# Patient Record
Sex: Female | Born: 1960 | Race: White | Hispanic: No | Marital: Single
Health system: Southern US, Community
[De-identification: ages and names within clinical notes are randomized; demographics above are authoritative.]

---

## 1999-01-12 ENCOUNTER — Other Ambulatory Visit: Admission: RE | Admit: 1999-01-12 | Discharge: 1999-01-12 | Payer: Self-pay | Admitting: Obstetrics and Gynecology

## 1999-12-05 ENCOUNTER — Other Ambulatory Visit: Admission: RE | Admit: 1999-12-05 | Discharge: 1999-12-05 | Payer: Self-pay | Admitting: Obstetrics and Gynecology

## 1999-12-05 ENCOUNTER — Other Ambulatory Visit: Admission: RE | Admit: 1999-12-05 | Discharge: 1999-12-05 | Payer: Self-pay | Admitting: *Deleted

## 2000-05-03 ENCOUNTER — Inpatient Hospital Stay (HOSPITAL_COMMUNITY): Admission: AD | Admit: 2000-05-03 | Discharge: 2000-05-03 | Payer: Self-pay | Admitting: *Deleted

## 2000-06-20 ENCOUNTER — Inpatient Hospital Stay (HOSPITAL_COMMUNITY): Admission: AD | Admit: 2000-06-20 | Discharge: 2000-06-23 | Payer: Self-pay | Admitting: Obstetrics & Gynecology

## 2000-08-09 ENCOUNTER — Other Ambulatory Visit: Admission: RE | Admit: 2000-08-09 | Discharge: 2000-08-09 | Payer: Self-pay | Admitting: Obstetrics and Gynecology

## 2001-07-26 ENCOUNTER — Inpatient Hospital Stay (HOSPITAL_COMMUNITY): Admission: AD | Admit: 2001-07-26 | Discharge: 2001-07-28 | Payer: Self-pay | Admitting: Obstetrics and Gynecology

## 2002-10-10 ENCOUNTER — Other Ambulatory Visit: Admission: RE | Admit: 2002-10-10 | Discharge: 2002-10-10 | Payer: Self-pay | Admitting: Obstetrics & Gynecology

## 2003-12-15 ENCOUNTER — Other Ambulatory Visit: Admission: RE | Admit: 2003-12-15 | Discharge: 2003-12-15 | Payer: Self-pay | Admitting: Obstetrics & Gynecology

## 2004-09-19 ENCOUNTER — Other Ambulatory Visit: Admission: RE | Admit: 2004-09-19 | Discharge: 2004-09-19 | Payer: Self-pay | Admitting: Obstetrics and Gynecology

## 2012-02-07 ENCOUNTER — Other Ambulatory Visit: Payer: Self-pay | Admitting: Obstetrics and Gynecology

## 2012-02-07 DIAGNOSIS — N63 Unspecified lump in unspecified breast: Secondary | ICD-10-CM

## 2012-02-07 DIAGNOSIS — R922 Inconclusive mammogram: Secondary | ICD-10-CM

## 2012-02-20 ENCOUNTER — Other Ambulatory Visit: Payer: Self-pay | Admitting: Obstetrics and Gynecology

## 2012-02-20 ENCOUNTER — Ambulatory Visit
Admission: RE | Admit: 2012-02-20 | Discharge: 2012-02-20 | Disposition: A | Discharge status: Home / Self Care | Payer: 59 | Source: Ambulatory Visit | Attending: Obstetrics and Gynecology | Admitting: Obstetrics and Gynecology

## 2012-02-20 DIAGNOSIS — R922 Inconclusive mammogram: Secondary | ICD-10-CM

## 2012-02-20 DIAGNOSIS — N63 Unspecified lump in unspecified breast: Secondary | ICD-10-CM

## 2013-08-11 ENCOUNTER — Other Ambulatory Visit: Payer: Self-pay | Admitting: Obstetrics and Gynecology

## 2013-08-12 LAB — CYTOLOGY - PAP

## 2014-06-17 ENCOUNTER — Other Ambulatory Visit: Payer: Self-pay | Admitting: Family Medicine

## 2014-06-17 DIAGNOSIS — R109 Unspecified abdominal pain: Secondary | ICD-10-CM

## 2014-06-22 ENCOUNTER — Ambulatory Visit
Admission: RE | Admit: 2014-06-22 | Discharge: 2014-06-22 | Disposition: A | Discharge status: Home / Self Care | Payer: 59 | Source: Ambulatory Visit | Attending: Family Medicine | Admitting: Family Medicine

## 2014-06-22 DIAGNOSIS — R109 Unspecified abdominal pain: Secondary | ICD-10-CM

## 2014-06-22 MED ORDER — IOPAMIDOL (ISOVUE-300) INJECTION 61%
100.0000 mL | Freq: Once | INTRAVENOUS | Status: AC | PRN
  Administered 2014-06-22: 100 mL via INTRAVENOUS

## 2014-06-25 ENCOUNTER — Other Ambulatory Visit: Payer: Self-pay

## 2014-08-25 ENCOUNTER — Other Ambulatory Visit: Payer: Self-pay | Admitting: Gastroenterology

## 2018-09-24 ENCOUNTER — Other Ambulatory Visit: Payer: Self-pay | Admitting: Obstetrics and Gynecology

## 2018-09-24 DIAGNOSIS — N644 Mastodynia: Secondary | ICD-10-CM

## 2018-10-01 ENCOUNTER — Ambulatory Visit
Admission: RE | Admit: 2018-10-01 | Discharge: 2018-10-01 | Disposition: A | Discharge status: Home / Self Care | Payer: 59 | Source: Ambulatory Visit | Attending: Obstetrics and Gynecology | Admitting: Obstetrics and Gynecology

## 2018-10-01 ENCOUNTER — Ambulatory Visit: Payer: 59

## 2018-10-01 ENCOUNTER — Other Ambulatory Visit: Payer: Self-pay

## 2018-10-01 DIAGNOSIS — N644 Mastodynia: Secondary | ICD-10-CM

## 2019-04-28 ENCOUNTER — Telehealth: Payer: Self-pay

## 2019-04-28 NOTE — Telephone Encounter (Signed)
NOTES ON FILE FROM JOHN MCCOMB [phone], SENT REFERRAL TO SCHEDULING 

## 2019-04-28 NOTE — Telephone Encounter (Signed)
NOTES ON FILE FROM dr Richardean Chimera [phone], SENT REFERRAL TO SCHEDULING

## 2019-05-27 ENCOUNTER — Encounter: Payer: Self-pay | Admitting: Family Medicine

## 2019-05-27 ENCOUNTER — Encounter: Payer: Self-pay | Admitting: General Practice

## 2019-05-28 ENCOUNTER — Encounter: Payer: Self-pay | Admitting: Family Medicine

## 2019-06-03 ENCOUNTER — Encounter: Payer: Self-pay | Admitting: General Practice

## 2019-08-14 ENCOUNTER — Other Ambulatory Visit: Payer: Self-pay | Admitting: Family Medicine

## 2019-08-14 DIAGNOSIS — Z8249 Family history of ischemic heart disease and other diseases of the circulatory system: Secondary | ICD-10-CM

## 2019-08-14 DIAGNOSIS — R7989 Other specified abnormal findings of blood chemistry: Secondary | ICD-10-CM

## 2019-08-14 DIAGNOSIS — R1013 Epigastric pain: Secondary | ICD-10-CM

## 2019-08-21 ENCOUNTER — Ambulatory Visit
Admission: RE | Admit: 2019-08-21 | Discharge: 2019-08-21 | Disposition: A | Discharge status: Home / Self Care | Payer: 59 | Source: Ambulatory Visit | Attending: Family Medicine | Admitting: Family Medicine

## 2019-08-21 DIAGNOSIS — R1013 Epigastric pain: Secondary | ICD-10-CM

## 2019-08-25 ENCOUNTER — Other Ambulatory Visit: Payer: 59

## 2019-08-28 ENCOUNTER — Other Ambulatory Visit: Payer: 59

## 2019-08-29 ENCOUNTER — Ambulatory Visit
Admission: RE | Admit: 2019-08-29 | Discharge: 2019-08-29 | Disposition: A | Discharge status: Home / Self Care | Payer: No Typology Code available for payment source | Source: Ambulatory Visit | Attending: Family Medicine | Admitting: Family Medicine

## 2019-08-29 DIAGNOSIS — R7989 Other specified abnormal findings of blood chemistry: Secondary | ICD-10-CM

## 2019-08-29 DIAGNOSIS — Z8249 Family history of ischemic heart disease and other diseases of the circulatory system: Secondary | ICD-10-CM

## 2019-10-21 ENCOUNTER — Ambulatory Visit: Payer: 59 | Admitting: Internal Medicine

## 2019-11-06 ENCOUNTER — Other Ambulatory Visit: Payer: Self-pay | Admitting: Obstetrics and Gynecology

## 2019-11-06 DIAGNOSIS — Z1231 Encounter for screening mammogram for malignant neoplasm of breast: Secondary | ICD-10-CM

## 2019-12-12 ENCOUNTER — Ambulatory Visit
Admission: RE | Admit: 2019-12-12 | Discharge: 2019-12-12 | Disposition: A | Discharge status: Home / Self Care | Payer: 59 | Source: Ambulatory Visit | Attending: Obstetrics and Gynecology | Admitting: Obstetrics and Gynecology

## 2019-12-12 ENCOUNTER — Other Ambulatory Visit: Payer: Self-pay

## 2019-12-12 DIAGNOSIS — Z1231 Encounter for screening mammogram for malignant neoplasm of breast: Secondary | ICD-10-CM

## 2020-01-22 ENCOUNTER — Ambulatory Visit: Payer: 59

## 2020-03-04 ENCOUNTER — Other Ambulatory Visit: Payer: Self-pay | Admitting: Obstetrics and Gynecology

## 2020-03-04 ENCOUNTER — Other Ambulatory Visit: Payer: Self-pay

## 2020-03-04 ENCOUNTER — Ambulatory Visit
Admission: RE | Admit: 2020-03-04 | Discharge: 2020-03-04 | Disposition: A | Discharge status: Home / Self Care | Payer: 59 | Source: Ambulatory Visit | Attending: Obstetrics and Gynecology | Admitting: Obstetrics and Gynecology

## 2020-03-04 DIAGNOSIS — Z1231 Encounter for screening mammogram for malignant neoplasm of breast: Secondary | ICD-10-CM

## 2021-09-26 ENCOUNTER — Other Ambulatory Visit: Payer: Self-pay | Admitting: Family Medicine

## 2021-09-26 DIAGNOSIS — Z1231 Encounter for screening mammogram for malignant neoplasm of breast: Secondary | ICD-10-CM

## 2021-10-19 ENCOUNTER — Ambulatory Visit: Payer: 59

## 2022-08-22 ENCOUNTER — Ambulatory Visit
Admission: RE | Admit: 2022-08-22 | Discharge: 2022-08-22 | Disposition: A | Discharge status: Home / Self Care | Payer: No Typology Code available for payment source | Source: Ambulatory Visit | Attending: Family Medicine | Admitting: Family Medicine

## 2022-08-22 DIAGNOSIS — Z1231 Encounter for screening mammogram for malignant neoplasm of breast: Secondary | ICD-10-CM

## 2022-08-24 ENCOUNTER — Other Ambulatory Visit: Payer: Self-pay | Admitting: Family Medicine

## 2022-08-24 DIAGNOSIS — R928 Other abnormal and inconclusive findings on diagnostic imaging of breast: Secondary | ICD-10-CM

## 2022-09-05 ENCOUNTER — Ambulatory Visit
Admission: RE | Admit: 2022-09-05 | Discharge: 2022-09-05 | Disposition: A | Discharge status: Home / Self Care | Payer: No Typology Code available for payment source | Source: Ambulatory Visit | Attending: Family Medicine | Admitting: Family Medicine

## 2022-09-05 DIAGNOSIS — R928 Other abnormal and inconclusive findings on diagnostic imaging of breast: Secondary | ICD-10-CM

## 2022-09-29 IMAGING — MG MM DIGITAL SCREENING BILAT W/ TOMO AND CAD
8 series · 9 of 24 positions shown · non-contrast
Comparison: Previous exam(s).

CLINICAL DATA: Screening.

EXAM:
DIGITAL SCREENING BILATERAL MAMMOGRAM WITH TOMOSYNTHESIS AND CAD
TECHNIQUE: Bilateral screening digital craniocaudal and mediolateral oblique
mammograms were obtained. Bilateral screening digital breast
tomosynthesis was performed. The images were evaluated with
computer-aided detection.

[L CC synth-2D]
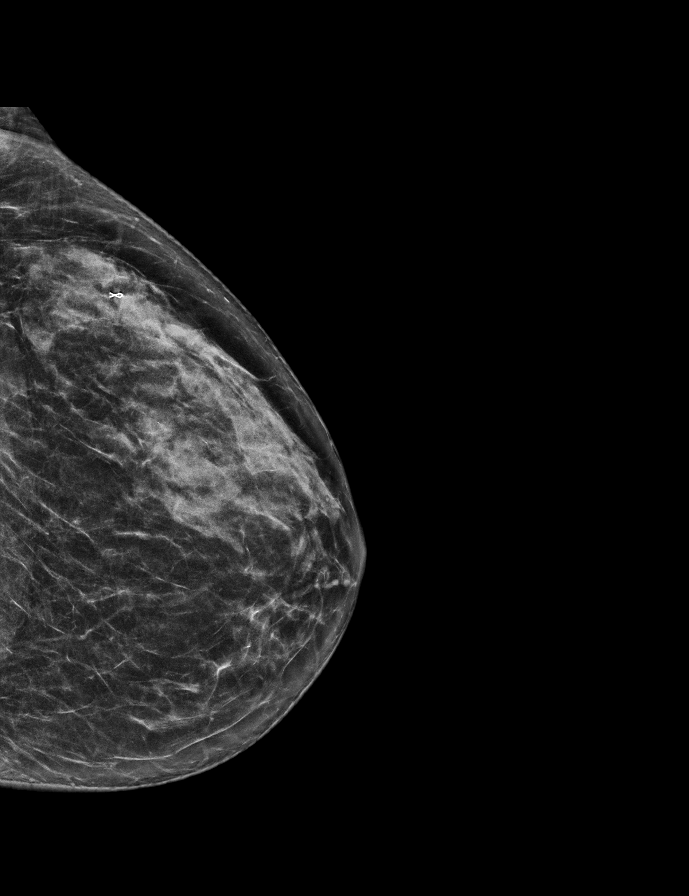

[R MLO synth-2D]
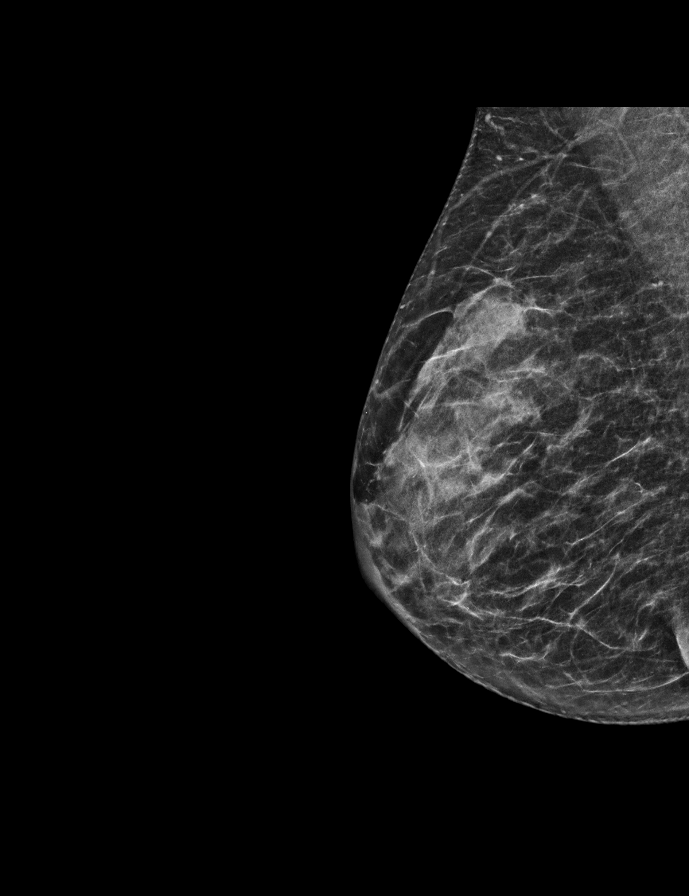

[R CC synth-2D]
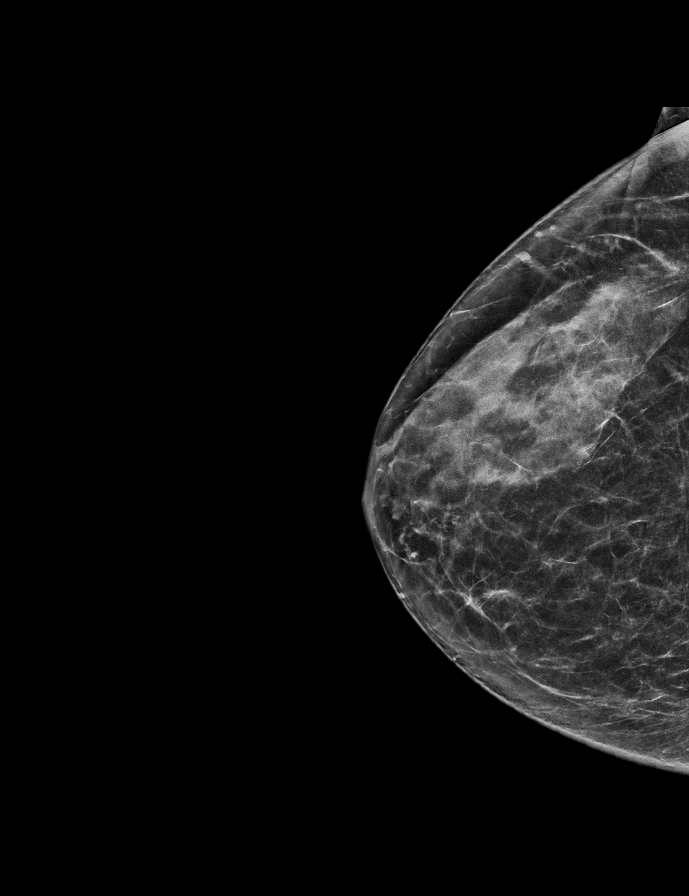

[L MLO synth-2D]
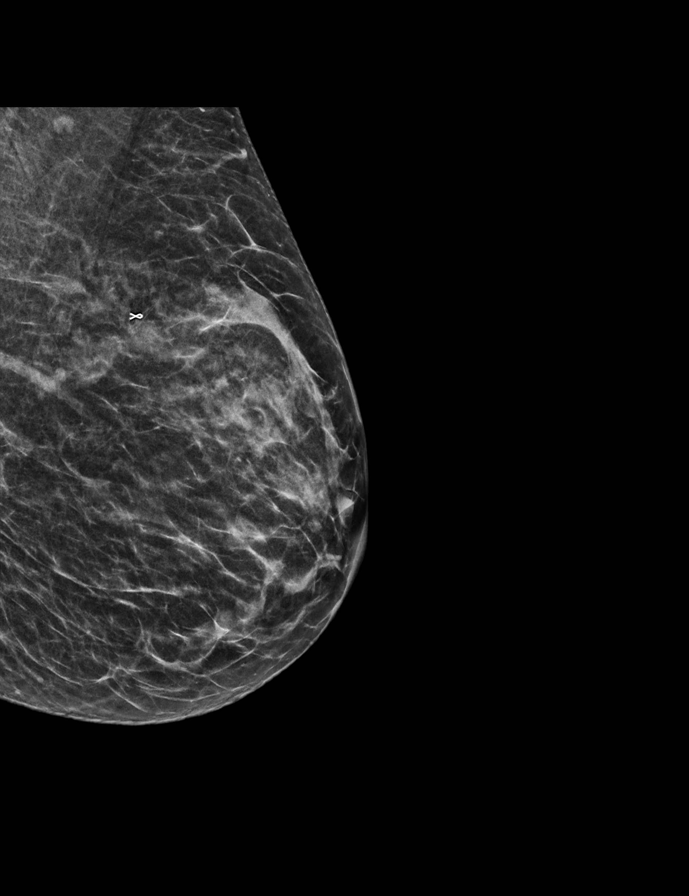

[R CC tomo · 2 of 57 frames shown]
[frame 19/57]
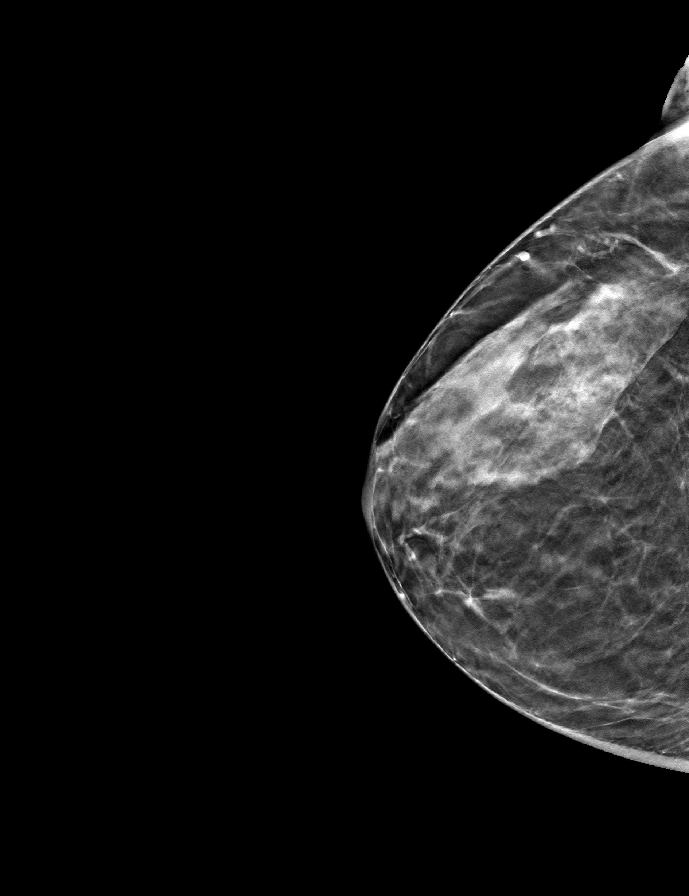
[frame 29/57]
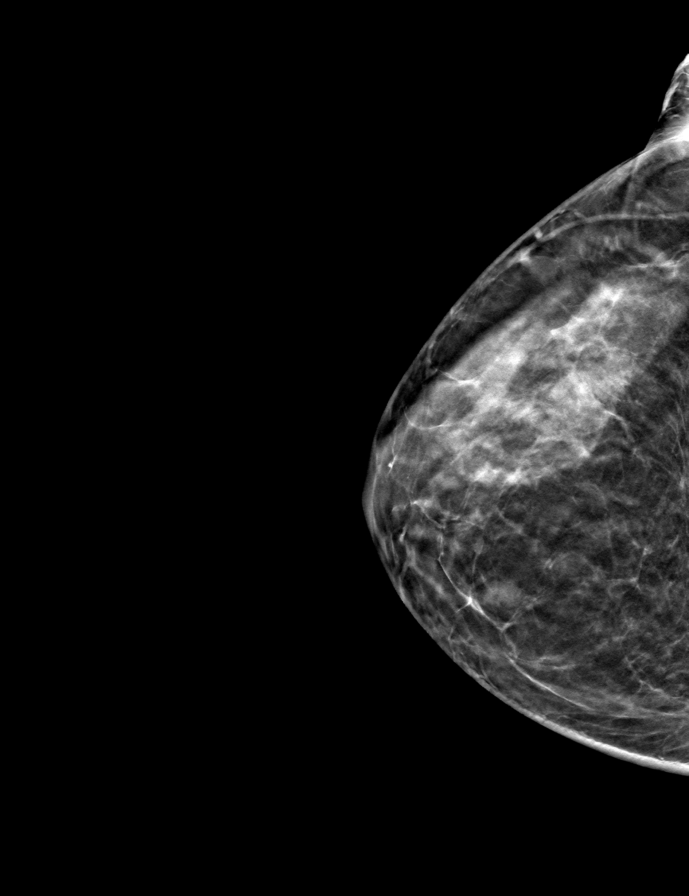

[L CC tomo · tomo slice 27/53.0]
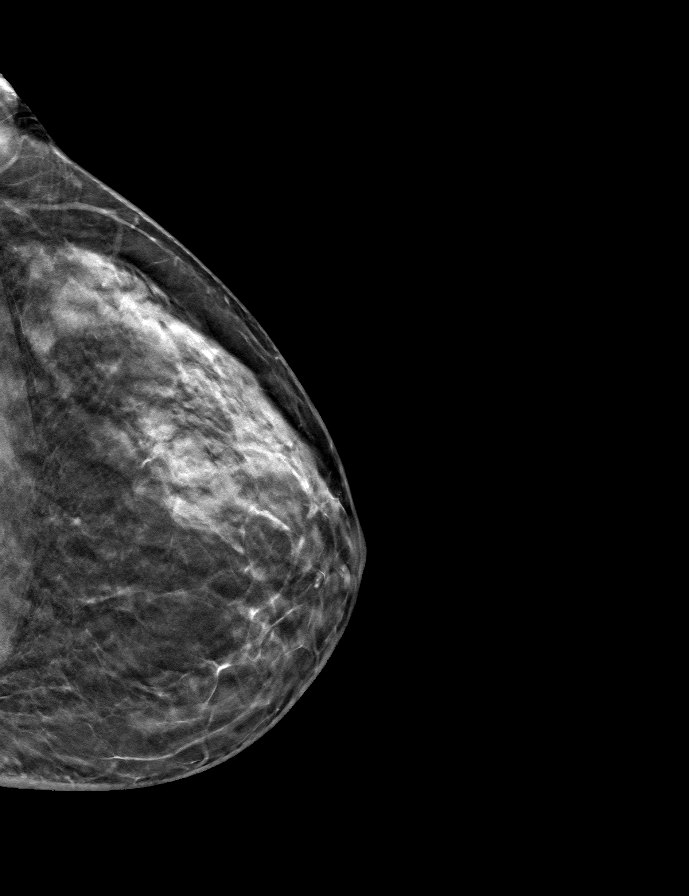

[R MLO tomo · tomo slice 26/51.0]
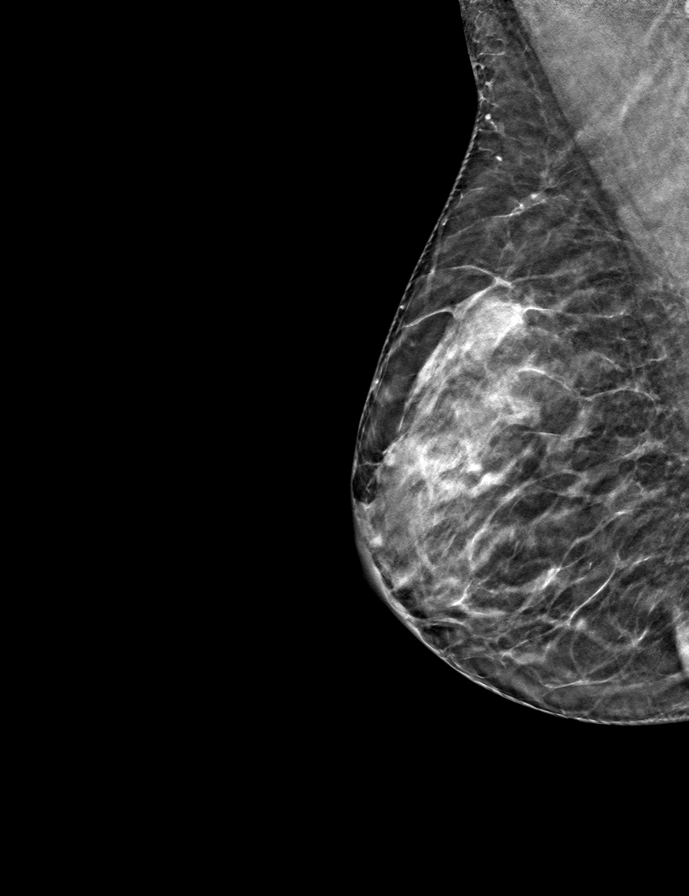

[L MLO tomo · tomo slice 25/48.0]
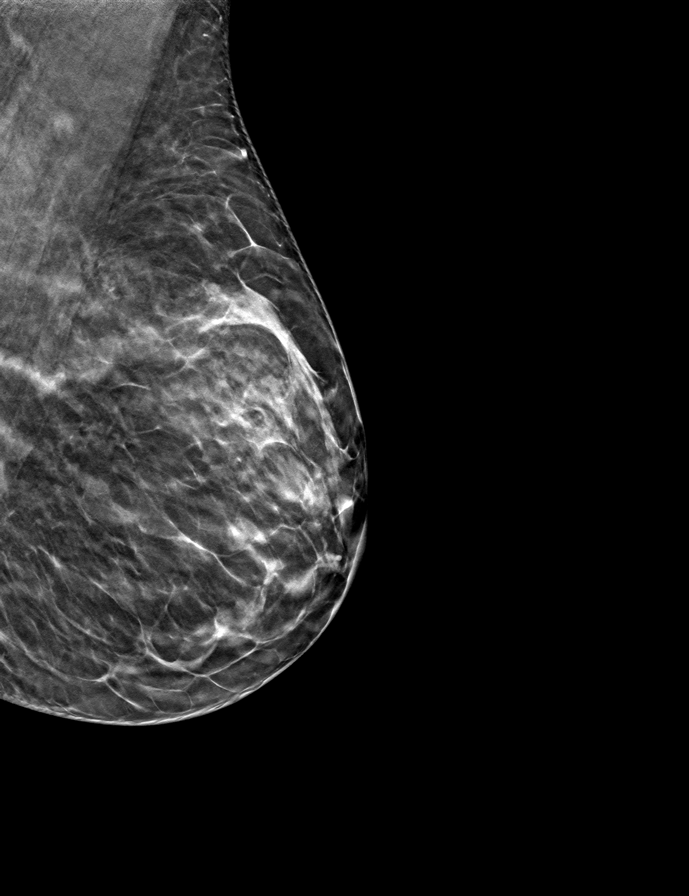

[9 of 24 positions shown; findings below may reference images not displayed]

ACR Breast Density Category c: The breast tissue is heterogeneously
dense, which may obscure small masses.
FINDINGS: There are no findings suspicious for malignancy. The images were
evaluated with computer-aided detection.
IMPRESSION: No mammographic evidence of malignancy. A result letter of this
screening mammogram will be mailed directly to the patient.

RECOMMENDATION:
Screening mammogram in one year. (Code:T4-5-GWO)

BI-RADS CATEGORY  1: Negative.
